# Patient Record
Sex: Female | Born: 1995
Health system: Southern US, Community
[De-identification: ages and names within clinical notes are randomized; demographics above are authoritative.]

## PROBLEM LIST (undated history)

## (undated) HISTORY — PX: TYMPANOSTOMY TUBE PLACEMENT: SHX32

---
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
## 2016-04-26 ENCOUNTER — Encounter (HOSPITAL_COMMUNITY): Payer: Self-pay | Admitting: Oncology

## 2016-04-26 ENCOUNTER — Emergency Department (HOSPITAL_COMMUNITY): Payer: Self-pay

## 2016-04-26 ENCOUNTER — Emergency Department (HOSPITAL_COMMUNITY)
Admission: EM | Admit: 2016-04-26 | Discharge: 2016-04-26 | Disposition: A | Discharge status: Home / Self Care | Payer: Self-pay | Attending: Emergency Medicine | Admitting: Emergency Medicine

## 2016-04-26 DIAGNOSIS — Z91010 Allergy to peanuts: Secondary | ICD-10-CM | POA: Insufficient documentation

## 2016-04-26 DIAGNOSIS — R519 Headache, unspecified: Secondary | ICD-10-CM

## 2016-04-26 DIAGNOSIS — R51 Headache: Secondary | ICD-10-CM

## 2016-04-26 DIAGNOSIS — J0100 Acute maxillary sinusitis, unspecified: Secondary | ICD-10-CM | POA: Insufficient documentation

## 2016-04-26 LAB — I-STAT CHEM 8, ED
BUN: 19 mg/dL (ref 6–20)
CHLORIDE: 102 mmol/L (ref 101–111)
CREATININE: 0.8 mg/dL (ref 0.44–1.00)
Calcium, Ion: 1.19 mmol/L (ref 1.15–1.40)
Glucose, Bld: 90 mg/dL (ref 65–99)
HEMATOCRIT: 40 % (ref 36.0–46.0)
Hemoglobin: 13.6 g/dL (ref 12.0–15.0)
POTASSIUM: 3.8 mmol/L (ref 3.5–5.1)
Sodium: 139 mmol/L (ref 135–145)
TCO2: 25 mmol/L (ref 0–100)

## 2016-04-26 MED ORDER — GADOBENATE DIMEGLUMINE 529 MG/ML IV SOLN
15.0000 mL | Freq: Once | INTRAVENOUS | Status: AC | PRN
  Administered 2016-04-26: 13 mL via INTRAVENOUS

## 2016-04-26 MED ORDER — AMOXICILLIN-POT CLAVULANATE 875-125 MG PO TABS: 1.0000 | ORAL_TABLET | Freq: Two times a day (BID) | ORAL | 0 refills | Status: AC

## 2016-04-26 MED ORDER — LORAZEPAM 2 MG/ML IJ SOLN
0.5000 mg | Freq: Once | INTRAMUSCULAR | Status: AC
  Administered 2016-04-26: 0.5 mg via INTRAVENOUS
  Filled 2016-04-26: qty 1

## 2016-04-26 MED ORDER — LORAZEPAM 1 MG PO TABS: 0.5000 mg | ORAL_TABLET | Freq: Every evening | ORAL | 0 refills | Status: DC | PRN

## 2016-04-26 MED ORDER — KETOROLAC TROMETHAMINE 30 MG/ML IJ SOLN
30.0000 mg | Freq: Once | INTRAMUSCULAR | Status: AC
  Administered 2016-04-26: 30 mg via INTRAVENOUS
  Filled 2016-04-26: qty 1

## 2016-04-26 MED ORDER — AMOXICILLIN-POT CLAVULANATE 875-125 MG PO TABS: 1.0000 | ORAL_TABLET | Freq: Two times a day (BID) | ORAL | 0 refills | Status: DC

## 2016-04-26 MED ORDER — PREDNISONE 10 MG PO TABS: 40.0000 mg | ORAL_TABLET | Freq: Every day | ORAL | 0 refills | Status: AC

## 2016-04-26 MED ORDER — CARBAMAZEPINE 200 MG PO TABS: 100.0000 mg | ORAL_TABLET | Freq: Once | ORAL | Status: DC

## 2016-04-26 MED ORDER — METHYLPREDNISOLONE SODIUM SUCC 125 MG IJ SOLR
125.0000 mg | Freq: Once | INTRAMUSCULAR | Status: AC
  Administered 2016-04-26: 125 mg via INTRAVENOUS
  Filled 2016-04-26: qty 2

## 2016-04-26 MED ORDER — HYDROMORPHONE HCL 1 MG/ML IJ SOLN
1.0000 mg | Freq: Once | INTRAMUSCULAR | Status: AC
  Administered 2016-04-26: 1 mg via INTRAVENOUS
  Filled 2016-04-26: qty 1

## 2016-04-26 MED ORDER — FLUTICASONE PROPIONATE 50 MCG/ACT NA SUSP: 1.0000 | Freq: Every day | NASAL | 2 refills | Status: DC

## 2016-04-26 NOTE — ED Triage Notes (Signed)
Pt c/o right ear pain x 4 days.  Pt has seen her dentist thinking it was related to her teeth however no acute findings. Pt states that the pain comes in waves.  Pain is also related to position.  Pain becomes more severe when pt is lying down.  Rates pain 10/10.

## 2016-04-26 NOTE — ED Notes (Signed)
Patient transported to MRI 

## 2016-04-26 NOTE — ED Provider Notes (Signed)
IMPRESSION:  Unremarkable skullbase and trigeminal nerve imaging.  2.3 cm right maxillary sinus mucous retention cyst which would  generally be asymptomatic.  Symmetric appearing cervical lymph node and tonsillar hypertrophy is  likely physiologic in this age group.   Patient was transferred to me by Antony MaduraKelly Humes PA-C pending MRI results with plan to discharge home with treatment for sinusitis if results are negative for trigeminal pathology. MRI results are negative for any trigeminal abnormality. Will discharge patient home with sinusitis treatment. Mom was concerned that she needed something to help her sleep, since they watched her in excruciating pain not sleeping over the past few nights. She was given ativan while in the ED and was able to sleep comfortably. Will send home with 4 tablets 0.5 mg bedtime PRN and advised to only use if absolutely necessary and follow up with PCP. Parents were very reliable and reasonable. Discussed plan with parents and they agreed with set plan.   Georgiana ShoreJessica B Kail Fraley, PA-C 04/26/16 1329    Donnetta HutchingBrian Cook, MD 04/27/16 [phone]

## 2016-04-26 NOTE — ED Notes (Signed)
Pt returned from MRI °

## 2016-04-26 NOTE — ED Provider Notes (Signed)
[facility] Provider Note   CSN: [id number] Arrival date & time: 04/26/16  0402    History   Chief Complaint Chief Complaint  Patient presents with  . Otalgia    HPI Sarah Roth is a 21 y.o. female.  21 year old female with no significant past medical history presents to the emergency department for evaluation of right-sided facial pain 4 days. Patient states that pain is intermittent and sharp in nature. She rates the pain at 10/10. She feels as though the pain is behind her ear. She also feels the pain associated with her upper dentition on the right side. The patient saw her dentist and endodontist who performed x-rays which were reassuring. She has been taking ibuprofen for symptoms without relief. She also had one Norco prior to arrival which did not help her symptoms. Patient notices worsening of her pain when she is lying supine. She has also had worsening exacerbations of the pain at night. She has not had any significant nasal congestion or rhinorrhea. No associated fevers, ear drainage, difficulty opening her mouth, or trauma. The patient did have chickenpox as a child. Her immunizations are up-to-date.      History reviewed. No pertinent past medical history.  There are no active problems to display for this patient.   Past Surgical History:  Procedure Laterality Date  . TYMPANOSTOMY TUBE PLACEMENT     as a baby    OB History    No data available       Home Medications    Prior to Admission medications   Not on File    Family History No family history on file.  Social History Social History  Substance Use Topics  . Smoking status: Never Smoker  . Smokeless tobacco: Never Used  . Alcohol use No     Allergies   Peanut-containing drug products and Shellfish allergy   Review of Systems Review of Systems Ten systems reviewed and are negative for acute change, except as noted in the HPI.    Physical Exam Updated Vital Signs BP  136/63 (BP Location: Left Arm)   Pulse 66   Temp 97.8 F (36.6 C) (Oral)   Resp 14   Ht 5\' 4"  (1.626 m)   Wt 65.8 kg   LMP 04/02/2016 (Exact Date)   SpO2 100%   BMI 24.89 kg/m   Physical Exam  Constitutional: She is oriented to person, place, and time. She appears well-developed and well-nourished. No distress.  Nontoxic appearing and in NAD  HENT:  Head: Normocephalic and atraumatic.  Nose: Right sinus exhibits maxillary sinus tenderness (mild).  Mouth/Throat: Oropharynx is clear and moist.  No trismus. No gingival swelling or fluctuance. No distinct dental tenderness.  Eyes: Conjunctivae and EOM are normal. No scleral icterus.  Neck: Normal range of motion.  No nuchal rigidity or meningismus.  Cardiovascular: Normal rate, regular rhythm and intact distal pulses.   Pulmonary/Chest: Effort normal. No respiratory distress. She has no wheezes.  Respirations even and unlabored.  Musculoskeletal: Normal range of motion.  Neurological: She is alert and oriented to person, place, and time. She exhibits normal muscle tone. Coordination normal.  Ambulatory with steady gait. GCS 15.  Skin: Skin is warm and dry. No rash noted. She is not diaphoretic. No erythema. No pallor.  Psychiatric: She has a normal mood and affect. Her behavior is normal.  Nursing note and vitals reviewed.    ED Treatments / Results  Labs (all labs ordered are listed, but only abnormal results  are displayed) Labs Reviewed  I-STAT CHEM 8, ED    EKG  EKG Interpretation None       Radiology No results found.  Procedures Procedures (including critical care time)  Medications Ordered in ED Medications  methylPREDNISolone sodium succinate (SOLU-MEDROL) 125 mg/2 mL injection 125 mg (125 mg Intravenous Given 04/26/16 0629)  ketorolac (TORADOL) 30 MG/ML injection 30 mg (30 mg Intravenous Given 04/26/16 1610)     Initial Impression / Assessment and Plan / ED Course  I have reviewed the triage vital signs  and the nursing notes.  Pertinent labs & imaging results that were available during my care of the patient were reviewed by me and considered in my medical decision making (see chart for details).     21 year old female presents to the emergency department for evaluation of right-sided facial pain. Location of pain and characteristics of severe, sharp, shooting pain raise concern for trigeminal neuralgia as patient has had no improvement with Norco or NSAIDs. There is possibility for underlying sinusitis, the patient has not had any significant nasal congestion or rhinorrhea.   Given concern for underlying trigeminal neuralgia which is atypical in a patient of this age, will obtain MRI for further evaluation. Patient to receive Solu-Medrol and Toradol for initial management. If MRI does so uncomplicated trigeminal neuralgia, the patient may benefit from the use of Tegretol on an outpatient basis. If negative imaging, plan to treat for uncomplicated sinusitis with Augmentin BID x 10 days, OTC nasal sprays, and prednisone taper.  Patient case signed out to Mathews Robinsons, PA-C at shift change who will follow up on imaging and disposition appropriately.   Final Clinical Impressions(s) / ED Diagnoses   Final diagnoses:  Right facial pain    New Prescriptions New Prescriptions   No medications on file     Antony Madura, PA-C 04/26/16 [id number]    Pricilla Loveless, MD 04/26/16 [phone]

## 2016-04-26 NOTE — Discharge Instructions (Signed)
Please take your antibiotic and its entirety regardless of resolution of symptoms. Stay well hydrated and monitor for any worsening of condition.

## 2016-11-24 MED FILL — predniSONE 20 MG TABS: 20 | 14 days supply | Qty: 21 | Fill #0

## 2017-02-02 MED FILL — hydrOXYzine HCL 25 MG TABS: 25 | 30 days supply | Qty: 30 | Fill #0

## 2017-02-02 MED FILL — TRIAMCINOLONE 0.1% OINTMENT: 0.1 | 14 days supply | Qty: 454 | Fill #0

## 2017-04-08 ENCOUNTER — Other Ambulatory Visit: Payer: Self-pay | Admitting: Hematology & Oncology

## 2017-04-08 DIAGNOSIS — N6001 Solitary cyst of right breast: Secondary | ICD-10-CM

## 2017-04-12 ENCOUNTER — Ambulatory Visit
Admission: RE | Admit: 2017-04-12 | Discharge: 2017-04-12 | Disposition: A | Discharge status: Home / Self Care | Payer: 59 | Source: Ambulatory Visit | Attending: Hematology & Oncology | Admitting: Hematology & Oncology

## 2017-06-03 ENCOUNTER — Telehealth: Payer: Self-pay | Admitting: Pharmacist

## 2017-06-03 NOTE — Telephone Encounter (Signed)
Received call from [facility] requesting a new prescription for Dupixent. I have never met with the patient through the [facility]. Therefore she will have to be seen in clinic first. Pharmacy made aware. Will attempt to contact patient to set up visit.

## 2017-06-04 ENCOUNTER — Telehealth: Payer: Self-pay | Admitting: Pharmacist

## 2017-06-04 NOTE — Telephone Encounter (Signed)
Received call back from both patient and patient's father last night, will call back today during normal business hours.

## 2017-06-08 ENCOUNTER — Ambulatory Visit (HOSPITAL_BASED_OUTPATIENT_CLINIC_OR_DEPARTMENT_OTHER): Payer: 59 | Admitting: Pharmacist

## 2017-06-08 DIAGNOSIS — Z79899 Other long term (current) drug therapy: Secondary | ICD-10-CM

## 2017-06-08 MED ORDER — DUPILUMAB 300 MG/2ML ~~LOC~~ SOSY: 2.0000 mL | PREFILLED_SYRINGE | SUBCUTANEOUS | 3 refills | Status: DC

## 2017-06-08 NOTE — Progress Notes (Signed)
   S: [facility] for review of their specialty medication therapy.  Patient is currently taking Dupixent for atopic dermatitis. Patient is managed by Dr. Emily FilbertGould for this.   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 She has been on break and getting ready to go back.  Adherence: has only had loading dose  Efficacy: has noticed some improvement but just had the loading dose so has not been on enough to really tell.   Dosing: 300 mg every 2 weeks  Dose adjustments: Renal: no dose adjustments (has not been studied) Hepatic: no dose adjustments (has not been studied)  Drug-drug interactions: none  Monitoring: S/sx of infection: denies CBC: monitored regularly per patient S/sx of hypersensitivity: denies S/sx of ocular effects: denies S/sx of eosinophilia/vasculitis: denies  O:     Lab Results  Component Value Date   HGB 13.6 04/26/2016   HCT 40.0 04/26/2016      Chemistry      Component Value Date/Time   NA 139 04/26/2016 0650   K 3.8 04/26/2016 0650   CL 102 04/26/2016 0650   BUN 19 04/26/2016 0650   CREATININE 0.80 04/26/2016 0650   No results found for: CALCIUM, ALKPHOS, AST, ALT, BILITOT     A/P: 1. Medication review: Patient currently on Dupixent for atopic dermatitis. Patient is tolerating well so far with no adverse effects. Reviewed the medication with her, including the following: Dupixent is an monoclonal antibody used to treat atopic dermatitis. Possible adverse effects include increased risk of infection, ocular effects, and injection site reactions. Patient feels comfortable with the injections. No recommendations for any changes.    Alvino BloodStacey Karl Hammer, PharmD, BCPS, BCACP, CPP Clinical Pharmacist Practitioner  [facility] [phone]

## 2017-06-14 MED FILL — DUPIXENT 300 MG/2 ML SAFE S: 300 | 28 days supply | Qty: 4 | Fill #0

## 2017-07-09 MED FILL — DUPIXENT 300 MG/2 ML SAFE S: 300 | 28 days supply | Qty: 4 | Fill #1

## 2017-08-20 MED FILL — DUPIXENT 300 MG/2 ML SAFE S: 300 | 28 days supply | Qty: 4 | Fill #2

## 2017-10-20 MED FILL — DUPIXENT 300 MG/2 ML SAFE S: 300 | 28 days supply | Qty: 4 | Fill #3

## 2018-01-06 ENCOUNTER — Other Ambulatory Visit: Payer: Self-pay | Admitting: Pharmacist

## 2018-01-06 MED ORDER — DUPILUMAB 300 MG/2ML ~~LOC~~ SOSY: 2.0000 mL | PREFILLED_SYRINGE | SUBCUTANEOUS | 2 refills | Status: DC

## 2018-01-06 MED FILL — DUPIXENT 300 MG/2 ML SAFE S: 300 | 28 days supply | Qty: 4 | Fill #0

## 2018-02-15 DIAGNOSIS — M79604 Pain in right leg: Secondary | ICD-10-CM | POA: Diagnosis not present

## 2018-02-18 ENCOUNTER — Encounter (HOSPITAL_COMMUNITY): Payer: Self-pay

## 2018-02-18 ENCOUNTER — Ambulatory Visit (HOSPITAL_COMMUNITY)
Admission: EM | Admit: 2018-02-18 | Discharge: 2018-02-18 | Disposition: A | Discharge status: Home / Self Care | Payer: 59 | Attending: Family Medicine | Admitting: Family Medicine

## 2018-02-18 DIAGNOSIS — H04202 Unspecified epiphora, left lacrimal gland: Secondary | ICD-10-CM | POA: Diagnosis not present

## 2018-02-18 MED ORDER — POLYMYXIN B-TRIMETHOPRIM 10000-0.1 UNIT/ML-% OP SOLN: 1.0000 [drp] | OPHTHALMIC | 0 refills | Status: AC

## 2018-02-18 MED ORDER — OLOPATADINE HCL 0.1 % OP SOLN: 1.0000 [drp] | Freq: Two times a day (BID) | OPHTHALMIC | 0 refills | Status: DC

## 2018-02-18 MED FILL — POLYMYXIN B/TMP EYE DROPS: 10000-0.1 | 16 days supply | Qty: 10 | Fill #0

## 2018-02-18 MED FILL — OLOPATADINE HCL 0.1% EYE DR: 0.1 | 5 days supply | Qty: 5 | Fill #0

## 2018-02-18 NOTE — ED Triage Notes (Signed)
Pt present left eye problem, drainage, itching, blurred vision.  Pt states this has been going on for a month.

## 2018-02-18 NOTE — Discharge Instructions (Signed)
May begin using polytrim eye drops- antibiotic for 1 week to treat any underlying infection  Use olopatadine eye drops twice daily as needed for itching and watering  Follow up if symptoms persisting, developing changes in vision, swelling, thick drainage, pain

## 2018-02-19 NOTE — ED Provider Notes (Signed)
[facility]    CSN: [id number] Arrival date & time: 02/18/18  1527     History   Chief Complaint Chief Complaint  Patient presents with  . Eye Problem    HPI Sarah Roth is a 22 y.o. female no protruding past medical history presenting today for evaluation of left eye itching and draining.  Patient states that over the past month she has had watery drainage off-and-on out of her left eye.  She states that she has been on Dupixent for eczema which has a side effect of watery eyes.  She is concerned as she has noticed crusting and matting in the morning.  She did not denies any pain or irritation.  She does have some itching associated with this.  Occasional blurred vision with the drainage, but no persistent changes in vision.  Patient does not work contacts.  She tried using over-the-counter Visine eyedrops without relief.  Denies any associated URI symptoms.  HPI  History reviewed. No pertinent past medical history.  There are no active problems to display for this patient.   Past Surgical History:  Procedure Laterality Date  . TYMPANOSTOMY TUBE PLACEMENT     as a baby    OB History   None      Home Medications    Prior to Admission medications   Medication Sig Start Date End Date Taking? Authorizing Provider  Dupilumab (DUPIXENT) 300 MG/2ML SOSY Inject 300 mg into the skin every 14 (fourteen) days. 2 ml inject one subq every other week as directed subcutaneous 28 01/06/18   Jegede, Olugbemiga E, MD  fluticasone (FLONASE) 50 MCG/ACT nasal spray Place 1 spray into both nostrils daily. 04/26/16   Mathews Robinsons B, PA-C  olopatadine (PATANOL) 0.1 % ophthalmic solution Place 1 drop into both eyes 2 (two) times daily. 02/18/18   Cannon Arreola C, PA-C  trimethoprim-polymyxin b (POLYTRIM) ophthalmic solution Place 1 drop into both eyes every 4 (four) hours for 7 days. 02/18/18 02/25/18  Sennie Borden, Junius Creamer, PA-C    Family History History reviewed. No  pertinent family history.  Social History Social History   Tobacco Use  . Smoking status: Never Smoker  . Smokeless tobacco: Never Used  Substance Use Topics  . Alcohol use: No  . Drug use: No     Allergies   Peanut-containing drug products and Shellfish allergy   Review of Systems Review of Systems  Constitutional: Negative for activity change, appetite change, chills, fatigue and fever.  HENT: Negative for congestion, ear pain, rhinorrhea, sinus pressure, sore throat and trouble swallowing.   Eyes: Positive for discharge and itching. Negative for redness.  Respiratory: Negative for cough, chest tightness and shortness of breath.   Cardiovascular: Negative for chest pain.  Gastrointestinal: Negative for abdominal pain, diarrhea, nausea and vomiting.  Musculoskeletal: Negative for myalgias.  Skin: Negative for rash.  Neurological: Negative for dizziness, light-headedness and headaches.     Physical Exam Triage Vital Signs ED Triage Vitals  Enc Vitals Group     BP --      Pulse Rate 02/18/18 1609 75     Resp 02/18/18 1609 18     Temp 02/18/18 1609 98.6 F (37 C)     Temp Source 02/18/18 1609 Skin     SpO2 02/18/18 1609 99 %     Weight --      Height --      Head Circumference --      Peak Flow --  Pain Score 02/18/18 1612 0     Pain Loc --      Pain Edu? --      Excl. in GC? --    No data found.  Updated Vital Signs Pulse 75   Temp 98.6 F (37 C) (Skin)   Resp 18   LMP 01/04/2018   SpO2 99%   Visual Acuity Right Eye Distance:  20/20 Left Eye Distance:  20/20 Bilateral Distance:  20/20  Right Eye Near:   Left Eye Near:    Bilateral Near:     Physical Exam  Constitutional: She appears well-developed and well-nourished. No distress.  HENT:  Head: Normocephalic and atraumatic.  Eyes: Pupils are equal, round, and reactive to light. Conjunctivae and EOM are normal.  Conjunctival white, no erythema, mild watery drainage present in left eye, no  photophobia with exam, no fluorescein uptake  Neck: Normal range of motion. Neck supple.  Cardiovascular: Normal rate and regular rhythm.  No murmur heard. Pulmonary/Chest: Effort normal and breath sounds normal. No respiratory distress.  Abdominal: She exhibits no distension.  Musculoskeletal: She exhibits no edema.  Neurological: She is alert.  Skin: Skin is warm and dry.  Psychiatric: She has a normal mood and affect.  Nursing note and vitals reviewed.    UC Treatments / Results  Labs (all labs ordered are listed, but only abnormal results are displayed) Labs Reviewed - No data to display  EKG None  Radiology No results found.  Procedures Procedures (including critical care time)  Medications Ordered in UC Medications - No data to display  Initial Impression / Assessment and Plan / UC Course  I have reviewed the triage vital signs and the nursing notes.  Pertinent labs & imaging results that were available during my care of the patient were reviewed by me and considered in my medical decision making (see chart for details).    Patient with frequent watery drainage, given length of symptoms and matting of eyes will provide Polytrim to treat any underlying infection, recommended olopatadine eyedrops ultimately to help with drainage and itching as possible underlying allergic conjunctivitis.  Visual acuity intact, exam otherwise unremarkable.Discussed strict return precautions. Patient verbalized understanding and is agreeable with plan.  Final Clinical Impressions(s) / UC Diagnoses   Final diagnoses:  Watering of left eye     Discharge Instructions     May begin using polytrim eye drops- antibiotic for 1 week to treat any underlying infection  Use olopatadine eye drops twice daily as needed for itching and watering  Follow up if symptoms persisting, developing changes in vision, swelling, thick drainage, pain   ED Prescriptions    Medication Sig Dispense Auth.  Provider   trimethoprim-polymyxin b (POLYTRIM) ophthalmic solution Place 1 drop into both eyes every 4 (four) hours for 7 days. 10 mL Kupono Marling C, PA-C   olopatadine (PATANOL) 0.1 % ophthalmic solution Place 1 drop into both eyes 2 (two) times daily. 5 mL Zeenat Jeanbaptiste C, PA-C     Controlled Substance Prescriptions [place] Controlled Substance Registry consulted? Not Applicable   Lew DawesWieters, Jye Fariss C, [place]PA-C 02/19/18 [id number]

## 2018-03-08 DIAGNOSIS — E559 Vitamin D deficiency, unspecified: Secondary | ICD-10-CM | POA: Diagnosis not present

## 2018-03-08 DIAGNOSIS — M8589 Other specified disorders of bone density and structure, multiple sites: Secondary | ICD-10-CM | POA: Diagnosis not present

## 2018-03-08 DIAGNOSIS — M84361A Stress fracture, right tibia, initial encounter for fracture: Secondary | ICD-10-CM | POA: Diagnosis not present

## 2018-03-17 MED FILL — DUPIXENT 300 MG/2 ML SAFE S: 300 | 28 days supply | Qty: 4 | Fill #1

## 2018-04-22 MED FILL — DUPIXENT 300 MG/2 ML SAFE S: 300 | 28 days supply | Qty: 4 | Fill #2

## 2018-04-22 MED FILL — SHIPPING COST: 1 days supply | Qty: 1 | Fill #0

## 2018-06-02 DIAGNOSIS — H1013 Acute atopic conjunctivitis, bilateral: Secondary | ICD-10-CM | POA: Diagnosis not present

## 2018-06-02 DIAGNOSIS — J301 Allergic rhinitis due to pollen: Secondary | ICD-10-CM | POA: Diagnosis not present

## 2018-06-06 DIAGNOSIS — M8589 Other specified disorders of bone density and structure, multiple sites: Secondary | ICD-10-CM | POA: Diagnosis not present

## 2018-08-31 DIAGNOSIS — Z20828 Contact with and (suspected) exposure to other viral communicable diseases: Secondary | ICD-10-CM | POA: Diagnosis not present

## 2018-10-20 DIAGNOSIS — F419 Anxiety disorder, unspecified: Secondary | ICD-10-CM | POA: Diagnosis not present

## 2019-01-19 DIAGNOSIS — Z03818 Encounter for observation for suspected exposure to other biological agents ruled out: Secondary | ICD-10-CM | POA: Diagnosis not present

## 2019-01-19 DIAGNOSIS — M84361D Stress fracture, right tibia, subsequent encounter for fracture with routine healing: Secondary | ICD-10-CM | POA: Diagnosis not present

## 2019-02-18 DIAGNOSIS — M62838 Other muscle spasm: Secondary | ICD-10-CM | POA: Diagnosis not present

## 2019-09-18 DIAGNOSIS — Z79899 Other long term (current) drug therapy: Secondary | ICD-10-CM | POA: Diagnosis not present

## 2019-09-18 DIAGNOSIS — L309 Dermatitis, unspecified: Secondary | ICD-10-CM | POA: Diagnosis not present

## 2019-09-21 DIAGNOSIS — F411 Generalized anxiety disorder: Secondary | ICD-10-CM | POA: Diagnosis not present

## 2019-09-27 DIAGNOSIS — F411 Generalized anxiety disorder: Secondary | ICD-10-CM | POA: Diagnosis not present

## 2019-10-04 DIAGNOSIS — F411 Generalized anxiety disorder: Secondary | ICD-10-CM | POA: Diagnosis not present

## 2019-10-06 ENCOUNTER — Other Ambulatory Visit: Payer: Self-pay

## 2019-10-06 ENCOUNTER — Ambulatory Visit (HOSPITAL_BASED_OUTPATIENT_CLINIC_OR_DEPARTMENT_OTHER): Payer: 59 | Admitting: Pharmacist

## 2019-10-06 DIAGNOSIS — Z79899 Other long term (current) drug therapy: Secondary | ICD-10-CM

## 2019-10-06 MED ORDER — DUPIXENT 300 MG/2ML ~~LOC~~ SOSY: 300.0000 mg | PREFILLED_SYRINGE | SUBCUTANEOUS | 2 refills | Status: DC

## 2019-10-06 NOTE — Progress Notes (Signed)
° °  S: Patient presents for review of their specialty medication therapy.  Patient is currently prescribed Dupixent for atopic dermatitis. Patient is managed by Dr. Emily Filbert for this.   She is not currently taking the medication but has in the past with good results. She reports that she stopped the medication d/t injection fatigue.   Adherence: has not yet restarted the medication  Efficacy: reported almost clear skin with Dupixent previously but is not currently taking.    Dosing: 300 mg every 2 weeks  Dose adjustments: Renal: no dose adjustments (has not been studied) Hepatic: no dose adjustments (has not been studied)  Drug-drug interactions: none  Monitoring: S/sx of infection: denies CBC: monitored regularly per patient S/sx of hypersensitivity: denies S/sx of ocular effects: denies S/sx of eosinophilia/vasculitis: denies  O:   Lab Results  Component Value Date   HGB 13.6 04/26/2016   HCT 40.0 04/26/2016      Chemistry      Component Value Date/Time   NA 139 04/26/2016 0650   K 3.8 04/26/2016 0650   CL 102 04/26/2016 0650   BUN 19 04/26/2016 0650   CREATININE 0.80 04/26/2016 0650   No results found for: CALCIUM, ALKPHOS, AST, ALT, BILITOT     A/P: 1. Medication review: Patient currently prescribed but not taking Dupixent for atopic dermatitis. Patient has tolerated the medication well previously with no adverse effects. It worked well to clear her skin. Reviewed the medication with her, including the following: Dupixent is an monoclonal antibody used to treat atopic dermatitis. Possible adverse effects include increased risk of infection, ocular effects, and injection site reactions. Patient feels comfortable restarting the injections. No recommendations for any changes.    Butch Penny, PharmD, CPP Clinical Pharmacist [facility] [phone]

## 2019-10-11 DIAGNOSIS — F411 Generalized anxiety disorder: Secondary | ICD-10-CM | POA: Diagnosis not present

## 2019-10-18 DIAGNOSIS — F411 Generalized anxiety disorder: Secondary | ICD-10-CM | POA: Diagnosis not present

## 2019-11-01 DIAGNOSIS — F411 Generalized anxiety disorder: Secondary | ICD-10-CM | POA: Diagnosis not present

## 2019-11-06 ENCOUNTER — Other Ambulatory Visit: Payer: Self-pay | Admitting: Pharmacist

## 2019-11-06 MED ORDER — DUPIXENT 300 MG/2ML ~~LOC~~ SOSY: PREFILLED_SYRINGE | SUBCUTANEOUS | 2 refills | Status: DC

## 2019-11-10 DIAGNOSIS — S060X0A Concussion without loss of consciousness, initial encounter: Secondary | ICD-10-CM | POA: Diagnosis not present

## 2019-11-10 DIAGNOSIS — W228XXA Striking against or struck by other objects, initial encounter: Secondary | ICD-10-CM | POA: Diagnosis not present

## 2019-11-10 DIAGNOSIS — S0990XA Unspecified injury of head, initial encounter: Secondary | ICD-10-CM | POA: Diagnosis not present

## 2019-11-14 DIAGNOSIS — F411 Generalized anxiety disorder: Secondary | ICD-10-CM | POA: Diagnosis not present

## 2019-11-24 DIAGNOSIS — S0990XA Unspecified injury of head, initial encounter: Secondary | ICD-10-CM | POA: Diagnosis not present

## 2019-11-24 DIAGNOSIS — X58XXXA Exposure to other specified factors, initial encounter: Secondary | ICD-10-CM | POA: Diagnosis not present

## 2019-11-24 DIAGNOSIS — Z0189 Encounter for other specified special examinations: Secondary | ICD-10-CM | POA: Diagnosis not present

## 2019-11-24 DIAGNOSIS — G43009 Migraine without aura, not intractable, without status migrainosus: Secondary | ICD-10-CM | POA: Diagnosis not present

## 2019-11-28 ENCOUNTER — Other Ambulatory Visit: Payer: Self-pay | Admitting: Nurse Practitioner

## 2019-11-28 DIAGNOSIS — S36200A Unspecified injury of head of pancreas, initial encounter: Secondary | ICD-10-CM

## 2019-11-30 DIAGNOSIS — F411 Generalized anxiety disorder: Secondary | ICD-10-CM | POA: Diagnosis not present

## 2019-12-05 DIAGNOSIS — Z0189 Encounter for other specified special examinations: Secondary | ICD-10-CM | POA: Diagnosis not present

## 2019-12-08 ENCOUNTER — Ambulatory Visit
Admission: RE | Admit: 2019-12-08 | Discharge: 2019-12-08 | Disposition: A | Discharge status: Home / Self Care | Payer: 59 | Source: Ambulatory Visit | Attending: Nurse Practitioner | Admitting: Nurse Practitioner

## 2019-12-08 DIAGNOSIS — Z043 Encounter for examination and observation following other accident: Secondary | ICD-10-CM | POA: Diagnosis not present

## 2019-12-08 DIAGNOSIS — Z01419 Encounter for gynecological examination (general) (routine) without abnormal findings: Secondary | ICD-10-CM | POA: Diagnosis not present

## 2019-12-08 DIAGNOSIS — Z202 Contact with and (suspected) exposure to infections with a predominantly sexual mode of transmission: Secondary | ICD-10-CM | POA: Diagnosis not present

## 2019-12-08 DIAGNOSIS — S36200A Unspecified injury of head of pancreas, initial encounter: Secondary | ICD-10-CM
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
## 2019-12-15 MED FILL — METRONIDAZOLE 500 MG TABS: 500 | 7 days supply | Qty: 14 | Fill #0

## 2019-12-21 DIAGNOSIS — F411 Generalized anxiety disorder: Secondary | ICD-10-CM | POA: Diagnosis not present

## 2019-12-30 DIAGNOSIS — F411 Generalized anxiety disorder: Secondary | ICD-10-CM | POA: Diagnosis not present

## 2020-01-05 DIAGNOSIS — F411 Generalized anxiety disorder: Secondary | ICD-10-CM | POA: Diagnosis not present

## 2020-01-09 ENCOUNTER — Other Ambulatory Visit: Payer: Self-pay | Admitting: Pharmacist

## 2020-01-09 MED ORDER — DUPIXENT 300 MG/2ML ~~LOC~~ SOSY: PREFILLED_SYRINGE | SUBCUTANEOUS | 2 refills | Status: DC

## 2020-01-17 ENCOUNTER — Telehealth: Payer: Self-pay | Admitting: Pharmacist

## 2020-01-30 MED FILL — DUPIXENT 300 MG/2 ML SAFE S: 300 | 28 days supply | Qty: 4 | Fill #0

## 2020-01-31 ENCOUNTER — Other Ambulatory Visit (HOSPITAL_COMMUNITY): Payer: Self-pay | Admitting: Dermatology

## 2020-01-31 MED FILL — TRIAMCINOLONE 0.1% OINTMENT: 0.1 | 14 days supply | Qty: 454 | Fill #0

## 2020-02-22 NOTE — Telephone Encounter (Signed)
Entered in error

## 2020-03-04 MED FILL — DUPIXENT 300 MG/2 ML SAFE S: 300 | 28 days supply | Qty: 4 | Fill #1

## 2020-04-20 DIAGNOSIS — F411 Generalized anxiety disorder: Secondary | ICD-10-CM | POA: Diagnosis not present

## 2020-04-27 DIAGNOSIS — F411 Generalized anxiety disorder: Secondary | ICD-10-CM | POA: Diagnosis not present

## 2020-05-11 DIAGNOSIS — F411 Generalized anxiety disorder: Secondary | ICD-10-CM | POA: Diagnosis not present

## 2020-05-25 DIAGNOSIS — F411 Generalized anxiety disorder: Secondary | ICD-10-CM | POA: Diagnosis not present

## 2020-06-12 DIAGNOSIS — F411 Generalized anxiety disorder: Secondary | ICD-10-CM | POA: Diagnosis not present

## 2020-06-18 ENCOUNTER — Other Ambulatory Visit (HOSPITAL_COMMUNITY): Payer: Self-pay

## 2020-09-10 ENCOUNTER — Other Ambulatory Visit (HOSPITAL_BASED_OUTPATIENT_CLINIC_OR_DEPARTMENT_OTHER): Payer: Self-pay

## 2020-10-07 ENCOUNTER — Other Ambulatory Visit (HOSPITAL_COMMUNITY): Payer: Self-pay

## 2020-10-08 ENCOUNTER — Other Ambulatory Visit (HOSPITAL_COMMUNITY): Payer: Self-pay

## 2021-04-17 ENCOUNTER — Other Ambulatory Visit (HOSPITAL_COMMUNITY): Payer: Self-pay

## 2021-04-18 ENCOUNTER — Other Ambulatory Visit (HOSPITAL_COMMUNITY): Payer: Self-pay

## 2021-06-04 ENCOUNTER — Other Ambulatory Visit (HOSPITAL_COMMUNITY): Payer: Self-pay

## 2021-06-06 ENCOUNTER — Other Ambulatory Visit (HOSPITAL_COMMUNITY): Payer: Self-pay

## 2021-07-15 ENCOUNTER — Other Ambulatory Visit: Payer: Self-pay

## 2021-07-15 ENCOUNTER — Encounter (HOSPITAL_BASED_OUTPATIENT_CLINIC_OR_DEPARTMENT_OTHER): Payer: Self-pay

## 2021-07-15 DIAGNOSIS — Z23 Encounter for immunization: Secondary | ICD-10-CM | POA: Diagnosis not present

## 2021-07-15 DIAGNOSIS — W540XXA Bitten by dog, initial encounter: Secondary | ICD-10-CM | POA: Diagnosis not present

## 2021-07-15 DIAGNOSIS — S71152A Open bite, left thigh, initial encounter: Secondary | ICD-10-CM | POA: Insufficient documentation
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
## 2021-07-16 ENCOUNTER — Emergency Department (HOSPITAL_BASED_OUTPATIENT_CLINIC_OR_DEPARTMENT_OTHER)
Admission: EM | Admit: 2021-07-16 | Discharge: 2021-07-16 | Disposition: A | Discharge status: Home / Self Care | Payer: 59 | Attending: Emergency Medicine | Admitting: Emergency Medicine

## 2021-07-16 DIAGNOSIS — S71152A Open bite, left thigh, initial encounter: Secondary | ICD-10-CM | POA: Diagnosis not present

## 2021-07-16 DIAGNOSIS — Z23 Encounter for immunization: Secondary | ICD-10-CM | POA: Diagnosis not present

## 2021-07-16 MED ORDER — FLUCONAZOLE 150 MG PO TABS: ORAL_TABLET | ORAL | 0 refills | Status: AC

## 2021-07-16 MED ORDER — AMOXICILLIN-POT CLAVULANATE 875-125 MG PO TABS
1.0000 | ORAL_TABLET | Freq: Once | ORAL | Status: AC
  Administered 2021-07-16: 1 via ORAL
  Filled 2021-07-16: qty 1

## 2021-07-16 MED ORDER — TETANUS-DIPHTH-ACELL PERTUSSIS 5-2.5-18.5 LF-MCG/0.5 IM SUSY
0.5000 mL | PREFILLED_SYRINGE | Freq: Once | INTRAMUSCULAR | Status: AC
  Administered 2021-07-16: 0.5 mL via INTRAMUSCULAR
  Filled 2021-07-16: qty 0.5

## 2021-07-16 MED ORDER — AMOXICILLIN-POT CLAVULANATE 875-125 MG PO TABS: 1.0000 | ORAL_TABLET | Freq: Two times a day (BID) | ORAL | 0 refills | Status: AC

## 2021-07-16 NOTE — Discharge Instructions (Signed)
Follow-up with animal control as instructed.  It is important that they investigate this dog and make sure it does not have rabies. ?

## 2021-07-16 NOTE — ED Provider Notes (Signed)
? ?[facility] ?Provider Note: Lowella Dell, MD, FACEP ? ?CSN: [id number] ?MRN: [id number] ?ARRIVAL: 07/15/21 at 2146 ?ROOM: DB011/DB011 ? ? ?CHIEF COMPLAINT  ?Animal Bite ? ? 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  She visited a house about 6 PM yesterday evening where she was bitten by the household's dog.  The bite is on the left medial thigh.  It is somewhat superficial.  It is not significantly painful at the present time.  The immunization status of the dog is unknown but the address is known and can be visited by animal control.  The patient's tetanus immunization is unknown. ? ? ?History reviewed. No pertinent past medical history. ? ?Past Surgical History:  ?Procedure Laterality Date  ? TYMPANOSTOMY TUBE PLACEMENT    ? as a baby  ? ? ?No family history on file. ? ?Social History  ? ?Tobacco Use  ? Smoking status: Never  ? Smokeless tobacco: Never  ?Substance Use Topics  ? Alcohol use: No  ? Drug use: No  ? ? ?Prior to Admission medications   ?Medication Sig Start Date End Date Taking? Authorizing Provider  ?amoxicillin-clavulanate (AUGMENTIN) 875-125 MG tablet Take 1 tablet by mouth 2 (two) times daily. One po bid x 7 days 07/16/21  Yes Dezarae Mcclaran, MD  ?fluconazole (DIFLUCAN) 150 MG tablet Take 1 tablet as needed for vaginal yeast infection.  May repeat in 3 days if symptoms persist. 07/16/21  Yes Melondy Blanchard, Jonny Ruiz, MD  ? ? ?Allergies ?Peanut-containing drug products and Shellfish allergy ? ? ?REVIEW OF SYSTEMS  ?Negative except as noted here or in the History of Present Illness. ? ? ?PHYSICAL EXAMINATION  ?Initial Vital Signs ?Blood pressure 122/79, pulse 68, temperature 98.4 ?F (36.9 ?C), resp. rate 17, height 5\' 4"  (1.626 m), weight 65.8 kg, SpO2 100 %. ? ?Examination ?General: Well-developed, well-nourished female in no acute distress; appearance consistent with age of record ?HENT: normocephalic; atraumatic ?Eyes: Normal  appearance ?Neck: supple ?Heart: regular rate and rhythm ?Lungs: clear to auscultation bilaterally ?Abdomen: soft; nondistended; nontender; bowel sounds present ?Extremities: No deformity; full range of motion ?Neurologic: Awake, alert and oriented; motor function intact in all extremities and symmetric; no facial droop ?Skin: Warm and dry; superficial wounds to medial left thigh consistent with dog bite: ? ? ? ?Psychiatric: Normal mood and affect ? ? ?RESULTS  ?Summary of this visit's results, reviewed and interpreted by myself: ? ? EKG Interpretation ? ?Date/Time:    ?Ventricular Rate:    ?PR Interval:    ?QRS Duration:   ?QT Interval:    ?QTC Calculation:   ?R Axis:     ?Text Interpretation:   ?  ? ?  ? ?Laboratory Studies: ?No results found for this or any previous visit (from the past 24 hour(s)). ?Imaging Studies: ?No results found. ? ?ED COURSE and MDM  ?Nursing notes, initial and subsequent vitals signs, including pulse oximetry, reviewed and interpreted by myself. ? ?Vitals:  ? 07/15/21 2157 07/15/21 2159 07/15/21 2201  ?BP: 122/79    ?Pulse: 68    ?Resp: 17    ?Temp: 98.4 ?F (36.9 ?C)    ?SpO2: 100%    ?Weight:  65.8 kg 65.8 kg  ?Height:  5\' 4"  (1.626 m)   ? ?Medications  ?Tdap (BOOSTRIX) injection 0.5 mL (has no administration in time range)  ?amoxicillin-clavulanate (AUGMENTIN) 875-125 MG per tablet 1 tablet (has no administration in time range)  ? ?  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  We will update her tetanus and provide local wound care.  The patient will contact animal control for further evaluation of the dog.  We will not start rabies immunization at this time pending animal control investigation. ? ?PROCEDURES  ?Procedures ? ? ?ED DIAGNOSES  ? ?  ICD-10-CM   ?1. Dog bite of left thigh, initial encounter  S71.152A   ? X72.0XXA   ?  ? ? ? ?  ?Kyrstal Monterrosa, MD ?07/16/21 0120 ? ?

## 2021-08-26 ENCOUNTER — Other Ambulatory Visit (HOSPITAL_COMMUNITY): Payer: Self-pay

## 2021-08-27 ENCOUNTER — Other Ambulatory Visit (HOSPITAL_COMMUNITY): Payer: Self-pay

## 2021-09-18 ENCOUNTER — Other Ambulatory Visit (HOSPITAL_COMMUNITY): Payer: Self-pay

## 2021-09-18 DIAGNOSIS — L810 Postinflammatory hyperpigmentation: Secondary | ICD-10-CM | POA: Diagnosis not present

## 2021-09-18 DIAGNOSIS — L309 Dermatitis, unspecified: Secondary | ICD-10-CM | POA: Diagnosis not present

## 2021-09-18 DIAGNOSIS — Z79899 Other long term (current) drug therapy: Secondary | ICD-10-CM | POA: Diagnosis not present

## 2021-09-18 MED ORDER — TRIAMCINOLONE ACETONIDE 0.1 % EX OINT
TOPICAL_OINTMENT | CUTANEOUS | 0 refills | Status: AC
  Filled 2021-09-18: qty 454, 14d supply, fill #0

## 2021-09-18 MED ORDER — BETAMETHASONE DIPROPIONATE 0.05 % EX CREA
TOPICAL_CREAM | CUTANEOUS | 1 refills | Status: AC
  Filled 2021-09-18: qty 60, 14d supply, fill #0

## 2021-09-19 ENCOUNTER — Other Ambulatory Visit (HOSPITAL_COMMUNITY): Payer: Self-pay

## 2021-10-06 ENCOUNTER — Other Ambulatory Visit (HOSPITAL_COMMUNITY): Payer: Self-pay

## 2021-10-07 ENCOUNTER — Other Ambulatory Visit (HOSPITAL_COMMUNITY): Payer: Self-pay

## 2021-10-07 MED ORDER — RINVOQ 15 MG PO TB24: ORAL_TABLET | ORAL | 3 refills | Status: DC

## 2021-10-13 ENCOUNTER — Other Ambulatory Visit (HOSPITAL_COMMUNITY): Payer: Self-pay

## 2021-10-13 ENCOUNTER — Telehealth: Payer: Self-pay | Admitting: Pharmacist

## 2021-10-13 MED ORDER — RINVOQ 15 MG PO TB24: ORAL_TABLET | ORAL | 3 refills | Status: DC

## 2021-10-13 NOTE — Telephone Encounter (Signed)
Called patient to schedule an appointment for the [facility]. I was unable to reach the patient so I left a HIPAA-compliant message requesting that the patient return my call.   Luke Van Ausdall, PharmD, BCACP, CPP Clinical Pharmacist [facility] [phone]  

## 2021-10-15 ENCOUNTER — Other Ambulatory Visit (HOSPITAL_COMMUNITY): Payer: Self-pay

## 2021-10-17 ENCOUNTER — Other Ambulatory Visit (HOSPITAL_COMMUNITY): Payer: Self-pay

## 2021-10-20 ENCOUNTER — Telehealth: Payer: Self-pay | Admitting: Pharmacist

## 2021-10-20 NOTE — Telephone Encounter (Signed)
Called patient to schedule an appointment for the [facility]. I was unable to reach the patient so I left a HIPAA-compliant message requesting that the patient return my call.   Luke Van Ausdall, PharmD, BCACP, CPP Clinical Pharmacist [facility] [phone]  

## 2021-10-24 ENCOUNTER — Other Ambulatory Visit (HOSPITAL_COMMUNITY): Payer: Self-pay

## 2022-03-20 ENCOUNTER — Other Ambulatory Visit (HOSPITAL_COMMUNITY): Payer: Self-pay

## 2022-04-07 ENCOUNTER — Emergency Department (HOSPITAL_COMMUNITY)
Admission: EM | Admit: 2022-04-07 | Discharge: 2022-04-08 | Disposition: A | Discharge status: Home / Self Care | Payer: 59 | Attending: Emergency Medicine | Admitting: Emergency Medicine

## 2022-04-07 ENCOUNTER — Encounter (HOSPITAL_COMMUNITY): Payer: Self-pay

## 2022-04-07 ENCOUNTER — Other Ambulatory Visit: Payer: Self-pay

## 2022-04-07 DIAGNOSIS — Z91010 Allergy to peanuts: Secondary | ICD-10-CM | POA: Diagnosis not present

## 2022-04-07 DIAGNOSIS — H109 Unspecified conjunctivitis: Secondary | ICD-10-CM | POA: Diagnosis not present

## 2022-04-07 DIAGNOSIS — H1033 Unspecified acute conjunctivitis, bilateral: Secondary | ICD-10-CM | POA: Diagnosis not present

## 2022-04-07 DIAGNOSIS — H5789 Other specified disorders of eye and adnexa: Secondary | ICD-10-CM | POA: Diagnosis present

## 2022-04-07 MED ORDER — FLUORESCEIN SODIUM 1 MG OP STRP
1.0000 | ORAL_STRIP | Freq: Once | OPHTHALMIC | Status: DC
  Filled 2022-04-07: qty 1

## 2022-04-07 NOTE — ED Triage Notes (Addendum)
Bilateral eye redness, itchiness, and involuntary tearing x 2 weeks. Concerned for pink eye.

## 2022-04-08 ENCOUNTER — Other Ambulatory Visit (HOSPITAL_COMMUNITY): Payer: Self-pay

## 2022-04-08 MED ORDER — TOBRAMYCIN 0.3 % OP SOLN
1.0000 [drp] | OPHTHALMIC | 0 refills | Status: AC
  Filled 2022-04-08: qty 5, 7d supply, fill #0

## 2022-04-08 MED ORDER — TOBRAMYCIN 0.3 % OP SOLN: 1.0000 [drp] | OPHTHALMIC | 0 refills | Status: DC

## 2022-04-08 NOTE — ED Provider Notes (Signed)
[facility] DEPT Provider Note   CSN: [id number] Arrival date & time: 04/07/22  2226     History  Chief Complaint  Patient presents with   Eye Drainage    Sarah Roth is a 27 y.o. female.  27 year old female presents to the emergency department for evaluation of eye redness.  She reports onset of redness to her right eye approximately 3 weeks ago.  This has come to include her left eye as well for the past week.  She reports associated itching and tearing.  She has been using over-the-counter eyedrops with mild intermittent relief.  Denies any eye trauma, vision loss, fevers.  No known exposure to persons with similar symptoms. She does reports seasonal allergies as well as dust and dander.  The history is provided by the patient. No language interpreter was used.       Home Medications Prior to Admission medications   Medication Sig Start Date End Date Taking? Authorizing Provider  amoxicillin-clavulanate (AUGMENTIN) 875-125 MG tablet Take 1 tablet by mouth 2 (two) times daily. One po bid x 7 days 07/16/21   Molpus, John, MD  betamethasone dipropionate 0.05 % cream Apply externally twice a day 14 days 09/18/21     fluconazole (DIFLUCAN) 150 MG tablet Take 1 tablet as needed for vaginal yeast infection.  May repeat in 3 days if symptoms persist. 07/16/21   Molpus, Jenny Reichmann, MD  tobramycin (TOBREX) 0.3 % ophthalmic solution Place 1 drop into both eyes every 4 (four) hours. Place one drop in the affected eye every 4 hours for 7 days 04/08/22   Antonietta Breach, PA-C  triamcinolone ointment (KENALOG) 0.1 % Apply to affected area Externally Twice a day 14 days 09/18/21         Allergies    Other, Peanut-containing drug products, Shellfish allergy, and Cephalosporins    Review of Systems   Review of Systems Ten systems reviewed and are negative for acute change, except as noted in the HPI.    Physical Exam Updated Vital Signs BP (!) 129/91   Pulse 76    Temp 98.1 F (36.7 C) (Oral)   Resp 18   SpO2 100%   Physical Exam Vitals and nursing note reviewed.  Constitutional:      General: She is not in acute distress.    Appearance: She is well-developed. She is not diaphoretic.     Comments: Nontoxic appearing and in NAD  HENT:     Head: Normocephalic and atraumatic.  Eyes:     General: No scleral icterus.    Extraocular Movements: Extraocular movements intact.     Pupils: Pupils are equal, round, and reactive to light.     Comments: Mild conjunctival injection without proptosis or hyphema. Normal EOMs. No uptake on fluorescein staining; negative Seidel's sign. Snellen 20/100 OD, 20/50 OS, and 20/40 OU without corrective lenses.  Pulmonary:     Effort: Pulmonary effort is normal. No respiratory distress.  Musculoskeletal:        General: Normal range of motion.     Cervical back: Normal range of motion.  Skin:    General: Skin is warm and dry.     Coloration: Skin is not pale.     Findings: No erythema or rash.  Neurological:     Mental Status: She is alert and oriented to person, place, and time.  Psychiatric:        Behavior: Behavior normal.     ED Results / Procedures / Treatments  Labs (all labs ordered are listed, but only abnormal results are displayed) Labs Reviewed - No data to display  EKG None  Radiology No results found.  Procedures Procedures    Medications Ordered in ED Medications  fluorescein ophthalmic strip 1 strip (has no administration in time range)    ED Course/ Medical Decision Making/ A&P                             Medical Decision Making Risk Prescription drug management.   This patient presents to the ED for concern of conjunctivitis, this involves an extensive number of treatment options, and is a complaint that carries with it a high risk of complications and morbidity.  The differential diagnosis includes bacterial conjunctivitis vs allergic conjunctivitis vs viral  conjunctivitis vs uveitis vs periorbital cellulitis vs orbital cellulitis.   Co morbidities that complicate the patient evaluation  None    Cardiac Monitoring:  The patient was maintained on a cardiac monitor.  I personally viewed and interpreted the cardiac monitored which showed an underlying rhythm of: NSR   Medicines ordered and prescription drug management:  I have reviewed the patients home medicines and have made adjustments as needed   Test Considered:  CT orbits - however, no concern for preseptal or orbital cellulitis based on exam.   Reevaluation:  After the interventions noted above, I reevaluated the patient and found that they have :stayed the same   Social Determinants of Health:  Insured patient   Dispostion:  After consideration of the diagnostic results and the patients response to treatment, I feel that the patent would benefit from outpatient ophthalmology follow-up especially given symptom chronicity.  She has been started on tobramycin drops for coverage of bacterial conjunctivitis; however, my suspicion for this is low given how long her eye redness has been ongoing.  Much more likely to be viral or allergic in nature.  Return precautions discussed and provided. Patient discharged in stable condition with no unaddressed concerns.          Final Clinical Impression(s) / ED Diagnoses Final diagnoses:  Conjunctivitis of both eyes, unspecified conjunctivitis type    Rx / DC Orders ED Discharge Orders          Ordered    tobramycin (TOBREX) 0.3 % ophthalmic solution  Every 4 hours,   Status:  Discontinued        04/08/22 0037    tobramycin (TOBREX) 0.3 % ophthalmic solution  Every 4 hours        04/08/22 0038              Antonietta Breach, PA-C 04/08/22 0737    Veryl Speak, MD 04/08/22 0451

## 2022-04-08 NOTE — Discharge Instructions (Addendum)
Use tobramycin as prescribed pending follow-up with an ophthalmologist.  Call to make an appointment with the eye doctor.

## 2022-04-09 DIAGNOSIS — Z202 Contact with and (suspected) exposure to infections with a predominantly sexual mode of transmission: Secondary | ICD-10-CM | POA: Diagnosis not present

## 2022-04-09 DIAGNOSIS — N7689 Other specified inflammation of vagina and vulva: Secondary | ICD-10-CM | POA: Diagnosis not present

## 2022-04-28 ENCOUNTER — Other Ambulatory Visit (HOSPITAL_COMMUNITY): Payer: Self-pay

## 2022-04-28 MED ORDER — AZITHROMYCIN 500 MG PO TABS
1000.0000 mg | ORAL_TABLET | Freq: Once | ORAL | 0 refills | Status: AC
  Filled 2022-04-28 – 2022-05-06 (×2): qty 2, 1d supply, fill #0

## 2022-04-28 MED ORDER — TINIDAZOLE 500 MG PO TABS
2000.0000 mg | ORAL_TABLET | Freq: Once | ORAL | 0 refills | Status: AC
  Filled 2022-04-28 – 2022-05-06 (×2): qty 4, 1d supply, fill #0

## 2022-05-06 ENCOUNTER — Other Ambulatory Visit (HOSPITAL_COMMUNITY): Payer: Self-pay

## 2022-08-11 DIAGNOSIS — N760 Acute vaginitis: Secondary | ICD-10-CM | POA: Diagnosis not present

## 2022-11-04 DIAGNOSIS — Z79899 Other long term (current) drug therapy: Secondary | ICD-10-CM | POA: Diagnosis not present

## 2022-11-04 DIAGNOSIS — L2084 Intrinsic (allergic) eczema: Secondary | ICD-10-CM | POA: Diagnosis not present

## 2022-11-04 DIAGNOSIS — L309 Dermatitis, unspecified: Secondary | ICD-10-CM | POA: Diagnosis not present

## 2023-01-19 ENCOUNTER — Other Ambulatory Visit: Payer: Self-pay

## 2023-01-19 MED ORDER — TRIAMCINOLONE ACETONIDE 0.1 % EX OINT
1.0000 | TOPICAL_OINTMENT | Freq: Two times a day (BID) | CUTANEOUS | 0 refills | Status: AC
  Filled 2023-01-19: qty 80, 90d supply, fill #0

## 2024-01-31 ENCOUNTER — Inpatient Hospital Stay: Admission: RE | Admit: 2024-01-31 | Payer: Self-pay | Source: Home / Self Care
